# Patient Record
Sex: Female | Born: 1950 | Race: Black or African American | Hispanic: No | Marital: Single | Smoking: Never smoker
Health system: Southern US, Community
[De-identification: ages and names within clinical notes are randomized; demographics above are authoritative.]

---

## 1997-08-19 ENCOUNTER — Ambulatory Visit (HOSPITAL_COMMUNITY): Admission: RE | Admit: 1997-08-19 | Discharge: 1997-08-19 | Payer: Self-pay | Admitting: Obstetrics and Gynecology

## 1997-09-19 ENCOUNTER — Ambulatory Visit (HOSPITAL_BASED_OUTPATIENT_CLINIC_OR_DEPARTMENT_OTHER): Admission: RE | Admit: 1997-09-19 | Discharge: 1997-09-19 | Payer: Self-pay | Admitting: *Deleted

## 1998-09-24 ENCOUNTER — Ambulatory Visit (HOSPITAL_COMMUNITY): Admission: RE | Admit: 1998-09-24 | Discharge: 1998-09-24 | Payer: Self-pay | Admitting: Obstetrics and Gynecology

## 1998-09-24 ENCOUNTER — Encounter: Payer: Self-pay | Admitting: Obstetrics and Gynecology

## 1998-10-05 ENCOUNTER — Ambulatory Visit (HOSPITAL_COMMUNITY): Admission: RE | Admit: 1998-10-05 | Discharge: 1998-10-05 | Payer: Self-pay | Admitting: Obstetrics and Gynecology

## 1998-10-05 ENCOUNTER — Encounter: Payer: Self-pay | Admitting: Obstetrics and Gynecology

## 1999-06-02 ENCOUNTER — Ambulatory Visit (HOSPITAL_COMMUNITY): Admission: RE | Admit: 1999-06-02 | Discharge: 1999-06-02 | Payer: Self-pay | Admitting: Obstetrics and Gynecology

## 1999-06-02 ENCOUNTER — Encounter: Payer: Self-pay | Admitting: Obstetrics and Gynecology

## 2004-10-04 ENCOUNTER — Ambulatory Visit: Payer: Self-pay | Admitting: Ophthalmology

## 2005-07-04 ENCOUNTER — Ambulatory Visit: Payer: Self-pay | Admitting: Ophthalmology

## 2011-01-28 DIAGNOSIS — E042 Nontoxic multinodular goiter: Secondary | ICD-10-CM | POA: Insufficient documentation

## 2011-01-28 DIAGNOSIS — E785 Hyperlipidemia, unspecified: Secondary | ICD-10-CM | POA: Insufficient documentation

## 2011-01-28 DIAGNOSIS — K219 Gastro-esophageal reflux disease without esophagitis: Secondary | ICD-10-CM | POA: Insufficient documentation

## 2011-01-28 DIAGNOSIS — I10 Essential (primary) hypertension: Secondary | ICD-10-CM | POA: Insufficient documentation

## 2011-01-28 DIAGNOSIS — E119 Type 2 diabetes mellitus without complications: Secondary | ICD-10-CM | POA: Insufficient documentation

## 2012-02-03 DIAGNOSIS — M8580 Other specified disorders of bone density and structure, unspecified site: Secondary | ICD-10-CM | POA: Insufficient documentation

## 2012-02-22 DIAGNOSIS — H9319 Tinnitus, unspecified ear: Secondary | ICD-10-CM | POA: Insufficient documentation

## 2012-02-22 DIAGNOSIS — H905 Unspecified sensorineural hearing loss: Secondary | ICD-10-CM | POA: Insufficient documentation

## 2012-08-02 ENCOUNTER — Ambulatory Visit: Payer: Self-pay | Admitting: Vascular Surgery

## 2012-12-14 DIAGNOSIS — L910 Hypertrophic scar: Secondary | ICD-10-CM | POA: Insufficient documentation

## 2013-07-29 IMAGING — CT CT ANGIOGRAPHY NECK
1 series · 12 of 14 positions shown · IV contrast (APPLIED)
Comparison: none

REASON FOR EXAM: labs 1st  visual disturbances carotid stenosis
COMMENTS:

PROCEDURE:     [facility] - KCT ANGIOGRAPHY NECK W/WO  - August 02, 2012  [DATE]
RESULT:     Comparison: None.
TECHNIQUE: Standard CTA protocol of the neck, after the administration of
100 mL Isovue 370 intravenous contrast. 3-D, multiplanar, and MIP images
were reviewed on a [employer] multiplanar work station.

[Series 4: 3 soft tissue · axial · 0.33mm/px · z∈[+809,+1040]mm · 12 of 93 slices shown]
[im 8/93  soft-tissue]
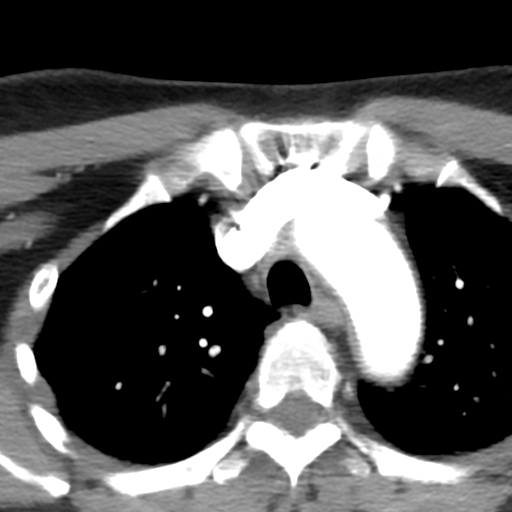
[im 15/93  bone]
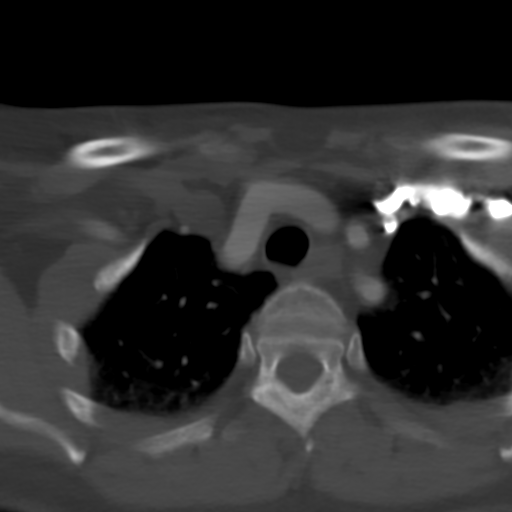
[im 22/93  soft-tissue]
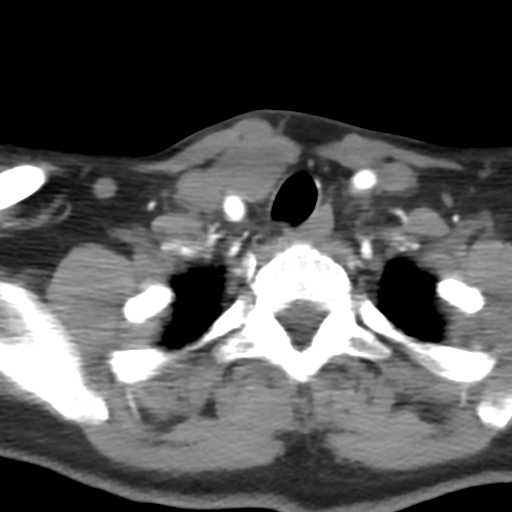
[im 29/93  bone]
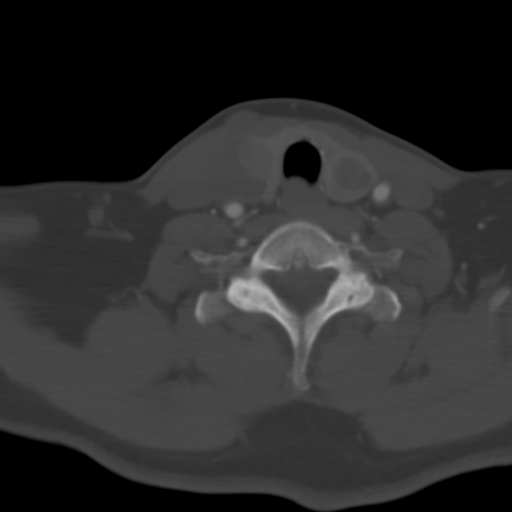
[im 36/93  soft-tissue]
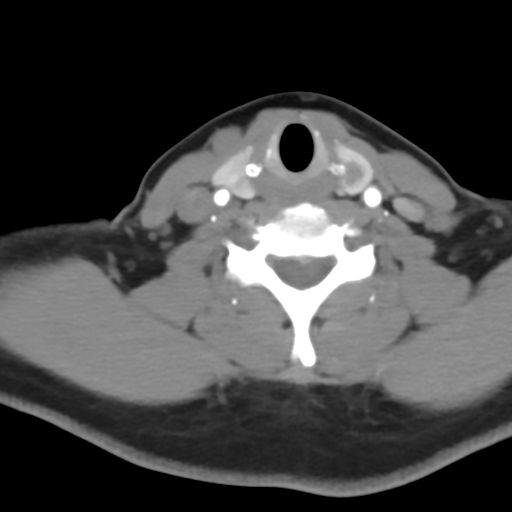
[im 43/93  bone]
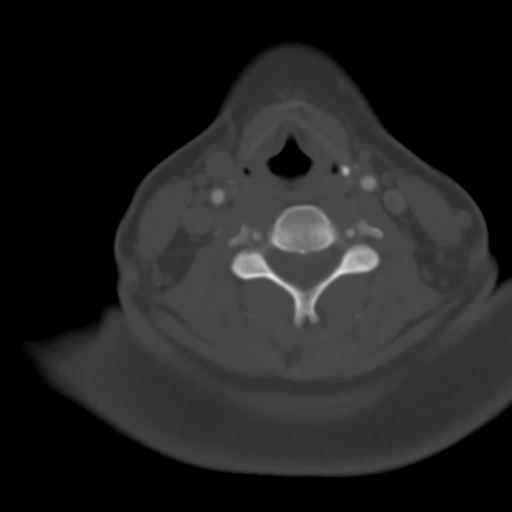
[im 50/93  soft-tissue]
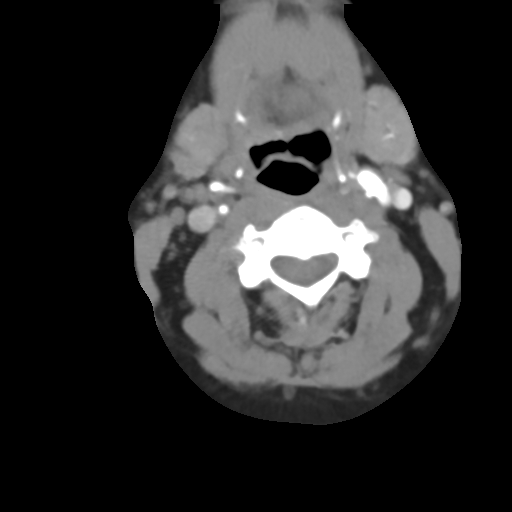
[im 57/93  bone]
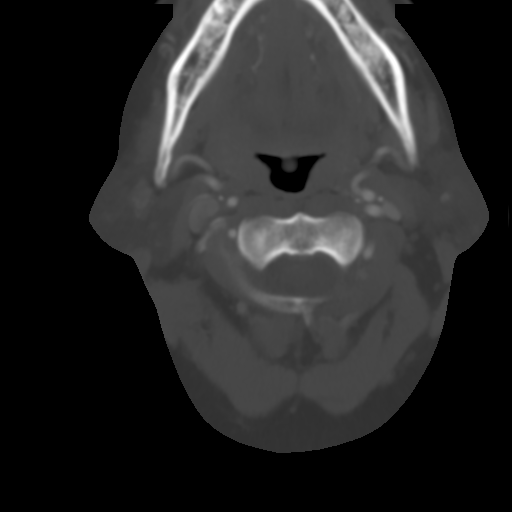
[im 64/93  soft-tissue]
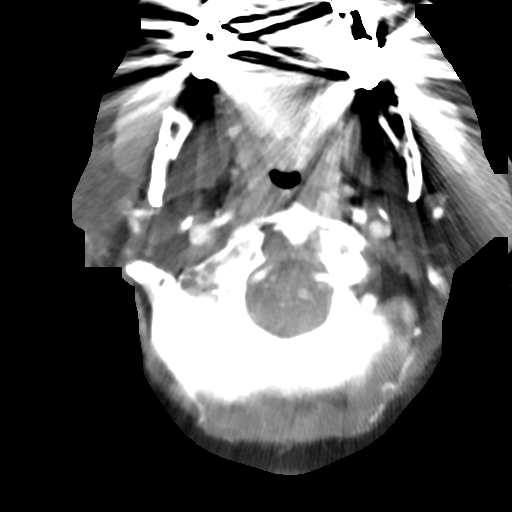
[im 71/93  bone]
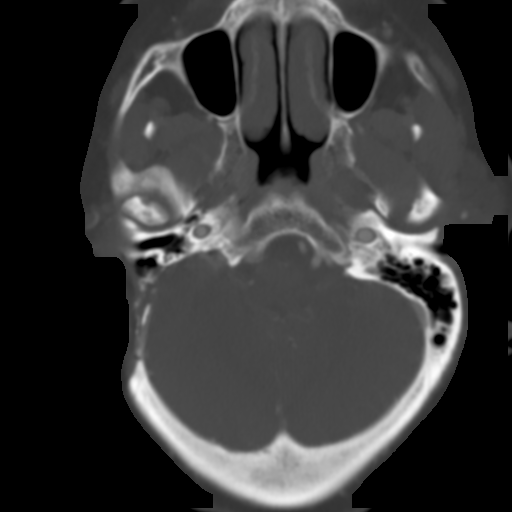
[im 78/93  soft-tissue]
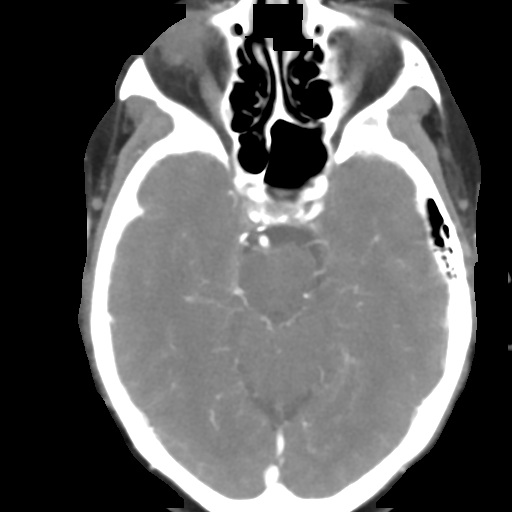
[im 85/93  bone]
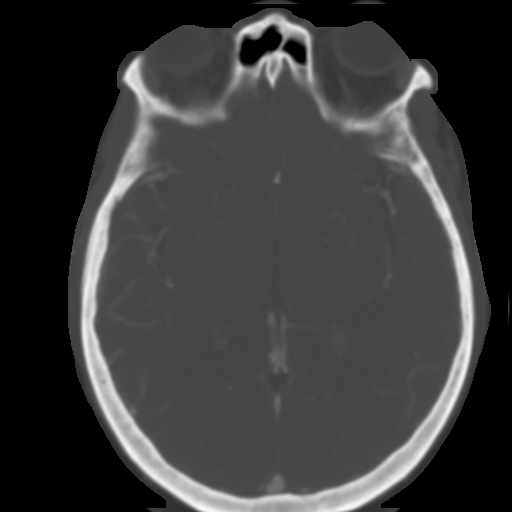

[12 of 14 positions shown; findings below may reference images not displayed]

FINDINGS: Calcified atherosclerotic plaque is demonstrated in the left carotid bulb.
There is no evidence of hemodynamically significant stenosis in the left
internal carotid artery. There is a focal high-grade, approximately 95%,
stenosis in the proximal right internal carotid artery. This is just distal
to the carotid bulb. Remainder of the right internal carotid artery is
normal in caliber. The vertebral arteries are patent.

There are multiple nodules within the bilateral thyroid lobes. The largest
is in the right lobe of the thyroid measuring 2.4 x 2.1 cm. It is relatively
cystic. No lymphadenopathy identified in the neck. There is a 4 mm nodule in
the right upper lobe, image 80.

No aggressive lytic or sclerotic osseous lesions are identified. Relative
absence of the right mastoid air cells is likely related to prior
mastoidectomy.
IMPRESSION: 1. Focal high-grade, approximately 95%, stenosis in the proximal right
internal carotid artery.
2. Multiple nodules in the thyroid gland, the largest of which measures
cm in greatest dimension. Thyroid ultrasound is suggested for further
evaluation.
3. Indeterminate 4 mm nodule in the right upper lobe. If the patient is at
low risk for lung cancer, no further follow-up is recommended.  If the
patient is at high risk for lung cancer, recommend 12 month follow-up
noncontrast chest CT.

## 2016-07-11 DIAGNOSIS — R058 Other specified cough: Secondary | ICD-10-CM | POA: Insufficient documentation

## 2016-07-11 DIAGNOSIS — R05 Cough: Secondary | ICD-10-CM | POA: Insufficient documentation

## 2016-09-09 DIAGNOSIS — Z7901 Long term (current) use of anticoagulants: Secondary | ICD-10-CM | POA: Insufficient documentation

## 2017-06-02 DIAGNOSIS — I6523 Occlusion and stenosis of bilateral carotid arteries: Secondary | ICD-10-CM | POA: Insufficient documentation

## 2017-10-25 ENCOUNTER — Other Ambulatory Visit: Payer: Self-pay | Admitting: Podiatry

## 2017-10-25 ENCOUNTER — Encounter: Payer: Self-pay | Admitting: Podiatry

## 2017-10-25 ENCOUNTER — Ambulatory Visit: Payer: 59 | Admitting: Podiatry

## 2017-10-25 ENCOUNTER — Ambulatory Visit (INDEPENDENT_AMBULATORY_CARE_PROVIDER_SITE_OTHER): Payer: 59

## 2017-10-25 VITALS — BP 135/72 | HR 85 | Temp 98.0°F

## 2017-10-25 DIAGNOSIS — M205X1 Other deformities of toe(s) (acquired), right foot: Secondary | ICD-10-CM

## 2017-10-25 DIAGNOSIS — M2012 Hallux valgus (acquired), left foot: Secondary | ICD-10-CM

## 2017-10-25 DIAGNOSIS — Z8673 Personal history of transient ischemic attack (TIA), and cerebral infarction without residual deficits: Secondary | ICD-10-CM | POA: Insufficient documentation

## 2017-10-25 DIAGNOSIS — M205X2 Other deformities of toe(s) (acquired), left foot: Secondary | ICD-10-CM

## 2017-10-25 DIAGNOSIS — E119 Type 2 diabetes mellitus without complications: Secondary | ICD-10-CM

## 2017-10-25 DIAGNOSIS — M2011 Hallux valgus (acquired), right foot: Secondary | ICD-10-CM

## 2017-10-25 DIAGNOSIS — H3561 Retinal hemorrhage, right eye: Secondary | ICD-10-CM | POA: Insufficient documentation

## 2017-10-25 DIAGNOSIS — Q828 Other specified congenital malformations of skin: Secondary | ICD-10-CM

## 2017-10-25 DIAGNOSIS — H35033 Hypertensive retinopathy, bilateral: Secondary | ICD-10-CM | POA: Insufficient documentation

## 2017-10-25 NOTE — Progress Notes (Signed)
  Subjective:  Patient ID: Diane Hayes, female    DOB: 03-Jun-1950,  MRN: [id number] HPI Chief Complaint  Patient presents with  . Callouses    Patient presents today for painful callous right hallux x 6-7 months and a callous starting on her left hallux x 1-2 months.  She reports sometimes she has sharp pains through hallux that comes and goes.  She is also concerned about ingrown great toenails bilat lateral borders and nails are starting to discolor x 1 month.  She has not done anything for treatment other than use lotion    67 y.o. female presents with the above complaint.   ROS: Denies fever chills nausea vomiting muscle aches pains calf pain back pain chest pain shortness of breath.  No past medical history on file.   Current Outpatient Medications:  .  aspirin EC 81 MG tablet, Take by mouth., Disp: , Rfl:  .  clopidogrel (PLAVIX) 75 MG tablet, TAKE 1 TABLET (75 MG TOTAL) BY MOUTH ONCE DAILY., Disp: , Rfl:  .  glimepiride (AMARYL) 2 MG tablet, 2 tabs (4 mg) am, 1 tab (2 mg) pm, Disp: , Rfl:  .  acetaminophen (TYLENOL) 500 MG tablet, Take by mouth., Disp: , Rfl:  .  amLODipine (NORVASC) 10 MG tablet, Take 10 mg by mouth daily., Disp: , Rfl: 1 .  atorvastatin (LIPITOR) 80 MG tablet, Take 80 mg by mouth daily., Disp: , Rfl: 1 .  chlorthalidone (HYGROTON) 25 MG tablet, Take 25 mg by mouth daily., Disp: , Rfl: 1 .  losartan (COZAAR) 100 MG tablet, Take 100 mg by mouth daily., Disp: , Rfl: 1 .  triamcinolone (NASACORT) 55 MCG/ACT AERO nasal inhaler, , Disp: , Rfl:   Allergies  Allergen Reactions  . Hydromorphone Tinitus  . Metformin And Related Diarrhea    Severe diarrhea   Review of Systems Objective:   Vitals:   10/25/17 0830  BP: 135/72  Pulse: 85  Temp: 98 F (36.7 C)    General: Well developed, nourished, in no acute distress, alert and oriented x3   Dermatological: Skin is warm, dry and supple bilateral. Nails x 10 are well maintained; remaining integument  appears unremarkable at this time. There are no open sores, no preulcerative lesions, no rash or signs of infection present.  Reactive hyper keratomas plantar medial aspect of the hallux interphalangeal joint bilaterally.  Toenails also demonstrate mild nail dystrophy cannot rule out onychomycosis fibular borders.  Vascular: Dorsalis Pedis artery and Posterior Tibial artery pedal pulses are 2/4 bilateral with immedate capillary fill time. Pedal hair growth present. No varicosities and no lower extremity edema present bilateral.   Neruologic: Grossly intact via light touch bilateral. Vibratory intact via tuning fork bilateral. Protective threshold with Semmes Wienstein monofilament intact to all pedal sites bilateral. Patellar and Achilles deep tendon reflexes 2+ bilateral. No Babinski or clonus noted bilateral.   Musculoskeletal: No gross boney pedal deformities bilateral. No pain, crepitus, or limitation noted with foot and ankle range of motion bilateral. Muscular strength 5/5 in all groups tested bilateral.  Gait: Unassisted, Nonantalgic.    Radiographs:  Radiographs taken today demonstrate an osseously mature foot rectus in nature mild hallux valgus deformity.  Assessment & Plan:   Assessment: Discussed etiology pathology conservative versus surgical therapies.  Plan: At this time point I debrided all reactive hyperkeratoses     Hajime Asfaw T. Harbor SpringsHyatt, [place] DakotaDPM

## 2018-07-20 ENCOUNTER — Encounter: Payer: Self-pay | Admitting: Podiatry

## 2018-07-20 ENCOUNTER — Ambulatory Visit: Payer: 59 | Admitting: Podiatry

## 2018-07-20 ENCOUNTER — Ambulatory Visit (INDEPENDENT_AMBULATORY_CARE_PROVIDER_SITE_OTHER): Payer: 59

## 2018-07-20 DIAGNOSIS — S99922A Unspecified injury of left foot, initial encounter: Secondary | ICD-10-CM | POA: Diagnosis not present

## 2018-07-20 DIAGNOSIS — L989 Disorder of the skin and subcutaneous tissue, unspecified: Secondary | ICD-10-CM | POA: Diagnosis not present

## 2018-07-20 DIAGNOSIS — M7752 Other enthesopathy of left foot: Secondary | ICD-10-CM

## 2018-07-23 NOTE — Progress Notes (Signed)
   HPI: 68 year old female presenting today with a chief complaint of constant aching pain to the left fourth and fifth toes that began about two months ago secondary to jamming them on her bed frame. Walking and wearing shoes increases the pain. She has been soaking the toes in Epsom salt and taking OTC Tylenol for pain. Patient is here for further evaluation and treatment.   No past medical history on file.    Objective: Physical Exam General: The patient is alert and oriented x3 in no acute distress.  Dermatology: Hyperkeratotic tissue noted to the 4th interdigital webspace of the left foot. Skin is cool, dry and supple bilateral lower extremities. Negative for open lesions or macerations.  Vascular: Palpable pedal pulses bilaterally. No edema or erythema noted. Capillary refill within normal limits.  Neurological: Epicritic and protective threshold grossly intact bilaterally.   Musculoskeletal Exam: All pedal and ankle joints range of motion within normal limits bilateral. Muscle strength 5/5 in all groups bilateral. Hammertoe contracture deformity noted to the 5th digit of the left foot. Pain with palpation noted to the left fifth toe.   Radiographic Exam: Hammertoe contracture deformity noted to the interphalangeal joints and MPJ of the respective hammertoe digits mentioned on clinical musculoskeletal exam.     Assessment: 1. Heloma Molle left 2. Hammertoe contracture digit 5 left 3. Capsulitis left 5th toe   Plan of Care:  1. Patient evaluated. X-Rays reviewed.  2. Excisional debridement of keratotic lesion using a chisel blade was performed without incident. Light dressing applied.  3. Injection of 0.5 mLs Celestone Soluspan injected into the left fifth toe.  4. Patient may need surgery if symptoms do not improve.  5. Recommended wide fitting shoes.  6. Return to clinic in 4 weeks.     Felecia Shelling, DPM [facility]  Dr. Felecia Shelling, DPM    [address] [place], [place] [address]                Office [phone]  Fax [phone]

## 2018-08-10 ENCOUNTER — Encounter: Payer: Self-pay | Admitting: Podiatry

## 2018-08-10 ENCOUNTER — Ambulatory Visit: Payer: 59 | Admitting: Podiatry

## 2018-08-10 ENCOUNTER — Other Ambulatory Visit: Payer: Self-pay

## 2018-08-10 DIAGNOSIS — M7752 Other enthesopathy of left foot: Secondary | ICD-10-CM | POA: Diagnosis not present

## 2018-08-10 DIAGNOSIS — L989 Disorder of the skin and subcutaneous tissue, unspecified: Secondary | ICD-10-CM

## 2018-08-14 NOTE — Progress Notes (Signed)
   HPI: 68 year old female presenting today for follow up evaluation of left foot pain. She states the pain has improved significantly. She denies any current pain or modifying factors. She states the injection she received at the last appointment helped alleviate her pain. Patient is here for further evaluation and treatment.   No past medical history on file.    Objective: Physical Exam General: The patient is alert and oriented x3 in no acute distress.  Dermatology: Hyperkeratotic tissue noted to the 4th interdigital webspace of the left foot. Skin is cool, dry and supple bilateral lower extremities. Negative for open lesions or macerations.  Vascular: Palpable pedal pulses bilaterally. No edema or erythema noted. Capillary refill within normal limits.  Neurological: Epicritic and protective threshold grossly intact bilaterally.   Musculoskeletal Exam: All pedal and ankle joints range of motion within normal limits bilateral. Muscle strength 5/5 in all groups bilateral. Hammertoe contracture deformity noted to the 5th digit of the left foot. Pain with palpation noted to the left fifth toe.   Assessment: 1. Heloma Molle left 2. Hammertoe contracture digit 5 left 3. Capsulitis left 5th toe - improved    Plan of Care:  1. Patient evaluated. 2. Excisional debridement of keratotic lesion using a chisel blade was performed without incident. Light dressing applied.  3. Patient may need surgery if symptoms do not improve.  4. Recommended wide fitting shoes.  6. Return to clinic as needed.      Felecia Shelling, DPM [facility]  Dr. Felecia Shelling, DPM    [address] [place], [place] [address]                Office [phone]  Fax [phone]

## 2019-04-11 DIAGNOSIS — L668 Other cicatricial alopecia: Secondary | ICD-10-CM | POA: Insufficient documentation

## 2019-04-11 DIAGNOSIS — L650 Telogen effluvium: Secondary | ICD-10-CM | POA: Insufficient documentation

## 2020-02-12 DIAGNOSIS — R42 Dizziness and giddiness: Secondary | ICD-10-CM | POA: Insufficient documentation

## 2020-03-04 DIAGNOSIS — H832X1 Labyrinthine dysfunction, right ear: Secondary | ICD-10-CM | POA: Insufficient documentation

## 2022-05-09 DIAGNOSIS — N1832 Chronic kidney disease, stage 3b: Secondary | ICD-10-CM | POA: Insufficient documentation

## 2022-07-21 DIAGNOSIS — D472 Monoclonal gammopathy: Secondary | ICD-10-CM | POA: Insufficient documentation

## 2022-08-10 ENCOUNTER — Ambulatory Visit (INDEPENDENT_AMBULATORY_CARE_PROVIDER_SITE_OTHER): Payer: Medicare Other | Admitting: Podiatry

## 2022-08-10 ENCOUNTER — Encounter: Payer: Self-pay | Admitting: Podiatry

## 2022-08-10 DIAGNOSIS — D2371 Other benign neoplasm of skin of right lower limb, including hip: Secondary | ICD-10-CM | POA: Diagnosis not present

## 2022-08-10 DIAGNOSIS — M7751 Other enthesopathy of right foot: Secondary | ICD-10-CM

## 2022-08-10 NOTE — Progress Notes (Signed)
She presents today after having not seen Dr. Amalia Hailey in a couple of years she is concerned about a callus to the medial aspect of the right foot.  States that she does not know because she is diabetic if this is something she needs to be concerned about.  Objective: Vital signs stable alert oriented x 3 there is no erythema Dem salines drainage odor pulses are strong and palpable.  Neurologic sensorium is intact Deetjen reflexes are intact muscle strength is normal and symmetrical.  Orthopedic evaluation demonstrates mild hallux valgus deformity mild hallux interphalangeal which is resulting in a reactive benign skin lesion to the plantar aspect of the proximal medial distal phalanx hallux right.  There is no open lesions or wounds at this point.  Just dry callused skin.  Assessment: Benign skin lesion plantar aspect of the hallux interphalangeal joint area.  Plan: I debrided this today placed padding and she will follow-up with [place] on an as-needed basis.

## 2024-03-06 ENCOUNTER — Ambulatory Visit (INDEPENDENT_AMBULATORY_CARE_PROVIDER_SITE_OTHER): Admitting: Podiatry

## 2024-03-06 DIAGNOSIS — D2371 Other benign neoplasm of skin of right lower limb, including hip: Secondary | ICD-10-CM

## 2024-03-06 NOTE — Progress Notes (Signed)
 She presents today chief complaint of a painful area on the plantar aspect of her hallux right foot.  She states that it is growing back as she refers to the right hallux at the level of the interphalangeal joint.  Objective: Benign skin lesion plantar aspect right foot.  No open lesions or wounds pulses are strongly palpable.  Assessment: Benign skin lesion hallux right.  Plan: Debridement of benign skin lesion.
